# Patient Record
Sex: Male | Born: 1975 | Race: White | Hispanic: No | Marital: Single | Smoking: Current every day smoker
Health system: Southern US, Community
[De-identification: ages and names within clinical notes are randomized; demographics above are authoritative.]

## PROBLEM LIST (undated history)

## (undated) DIAGNOSIS — N200 Calculus of kidney: Secondary | ICD-10-CM

## (undated) DIAGNOSIS — T7840XA Allergy, unspecified, initial encounter: Secondary | ICD-10-CM

## (undated) HISTORY — DX: Allergy, unspecified, initial encounter: T78.40XA

---

## 2004-04-17 ENCOUNTER — Emergency Department (HOSPITAL_COMMUNITY): Admission: EM | Admit: 2004-04-17 | Discharge: 2004-04-17 | Payer: Self-pay | Admitting: Emergency Medicine

## 2006-11-30 ENCOUNTER — Ambulatory Visit (HOSPITAL_COMMUNITY): Admission: RE | Admit: 2006-11-30 | Discharge: 2006-11-30 | Payer: Self-pay | Admitting: Urology

## 2010-08-07 NOTE — H&P (Signed)
Drew Long, Drew Long                  ACCOUNT NO.:  [id number]   MEDICAL RECORD NO.:  [id number]          PATIENT TYPE:  AMB   LOCATION:  DAY                           FACILITY:  [facility]   PHYSICIAN:  Ky Barban, M.D.DATE OF BIRTH:  1976/03/21   DATE OF ADMISSION:  DATE OF DISCHARGE:  LH                              HISTORY & PHYSICAL   CHIEF COMPLAINT:  Weakened right flank pain.   HISTORY OF PRESENT ILLNESS:  A 35 year old gentleman who came to the  emergency room about a week ago with right renal colic.  CT scan showed  3 mm stone in the right ureteral junction causing obstruction.  He  continued to have pain at home.  However, since,  no fever, chills or  voiding difficulty.  He has severe constipation also, and denies having  any urinary stone in the past.   PAST MEDICAL HISTORY:  Negative.   FAMILY HISTORY:  Negative.   PERSONAL HISTORY:  Does not smoke or drink.   REVIEW OF SYSTEMS:  __________.   PHYSICAL EXAMINATION:  VITAL SIGNS:  Blood pressure 113/73, temperature  97.7.  NEUROLOGICAL:  No gross neurological deficit.  HEENT:  Negative.  CHEST:  Symmetrical.  HEART:  Regular sinus rhythm, no murmur.  ABDOMEN:  Soft, flat.  Liver, spleen, kidneys are not palpable.  BACK:  1+ right CVA tenderness.  GENITALS:  Circumcised, meatus adequate, testicles are normal.  RECTAL:  Deferred.  EXTREMITIES:  Normal.   IMPRESSION:  Right ureteral calculus.   RECOMMENDATIONS:  Observation.  I told him if he continues to have pain  then we can go ahead and do the stone basket, and he wanted me to  schedule him for that.  I told him if he passes the stone to let me  know.  I  discussed the procedure of stone basket in detail with the patient and  his mother-in-law.  Told them of the procedure's complications  especially ureter perforation leading to open surgery, use of double J  stent.  Also placed on Flomax 0.4 mg one a day #7.  Samples are given.  Scheduled him for Friday,  12th of September.      Ky Barban, M.D.  Electronically Signed     MIJ/MEDQ  D:  12/01/2006  T:  12/01/2006  Job:  [id number]

## 2011-01-01 LAB — BASIC METABOLIC PANEL
BUN: 18
CO2: 32
Glucose, Bld: 165 — ABNORMAL HIGH
Potassium: 4.3
Sodium: 135

## 2011-01-01 LAB — URINALYSIS, ROUTINE W REFLEX MICROSCOPIC
Nitrite: NEGATIVE
Protein, ur: NEGATIVE
pH: 5.5

## 2011-01-01 LAB — CBC
HCT: 39.4
Hemoglobin: 13.4
MCHC: 34
MCV: 85.2
RDW: 13.1

## 2011-01-01 LAB — DIFFERENTIAL
Basophils Absolute: 0
Basophils Relative: 0
Eosinophils Absolute: 0.1
Eosinophils Relative: 2
Monocytes Absolute: 0.5

## 2011-01-01 LAB — URINE MICROSCOPIC-ADD ON

## 2012-03-27 ENCOUNTER — Ambulatory Visit (INDEPENDENT_AMBULATORY_CARE_PROVIDER_SITE_OTHER): Payer: Managed Care, Other (non HMO) | Admitting: Internal Medicine

## 2012-03-27 ENCOUNTER — Ambulatory Visit: Payer: Managed Care, Other (non HMO)

## 2012-03-27 VITALS — BP 97/61 | HR 88 | Temp 98.1°F | Resp 16 | Ht 69.0 in | Wt 164.6 lb

## 2012-03-27 DIAGNOSIS — R05 Cough: Secondary | ICD-10-CM

## 2012-03-27 DIAGNOSIS — IMO0001 Reserved for inherently not codable concepts without codable children: Secondary | ICD-10-CM

## 2012-03-27 DIAGNOSIS — F17200 Nicotine dependence, unspecified, uncomplicated: Secondary | ICD-10-CM | POA: Insufficient documentation

## 2012-03-27 DIAGNOSIS — J1100 Influenza due to unidentified influenza virus with unspecified type of pneumonia: Secondary | ICD-10-CM

## 2012-03-27 MED ORDER — HYDROCODONE-HOMATROPINE 5-1.5 MG/5ML PO SYRP: 5.0000 mL | ORAL_SOLUTION | Freq: Four times a day (QID) | ORAL | Status: AC | PRN

## 2012-03-27 MED ORDER — AZITHROMYCIN 500 MG PO TABS: 500.0000 mg | ORAL_TABLET | Freq: Every day | ORAL | Status: AC

## 2012-03-27 NOTE — Progress Notes (Signed)
  Subjective:    Patient ID: Drew Long, male    DOB: 02/16/1976, 37 y.o.   MRN: [id number]  HPI One week ago he developed the onset of flu symptoms/as his fever was resolving yesterday he developed a worsening of the cough with productive sputum and pleuritic-type chest discomfort anteriorly No shortness of breath/no hemoptysis No nausea and vomiting No underlying illnesses or medications He hasn't smoked in 5 days Review of Systems     Objective:   Physical Exam Vital signs stable TMs clear Nares clear Throat clear/no nodes Chest with rales at the left base/no wheezing      [facility] reading (PRIMARY) by  Dr. Merla Riches no infiltrate or effusion   Assessment & Plan:  Problem #1 pneumonia post flu Meds ordered this encounter  Medications  . azithromycin (ZITHROMAX) 500 MG tablet    Sig: Take 1 tablet (500 mg total) by mouth daily.    Dispense:  5 tablet    Refill:  0  . HYDROcodone-homatropine (HYCODAN) 5-1.5 MG/5ML syrup    Sig: Take 5 mLs by mouth every 6 (six) hours as needed for cough.    Dispense:  120 mL    Refill:  0   problem #2 nicotine addition Discuss continued efforts at cessation now that he has quit for 5 days Recheck in 3 days if not better

## 2012-04-09 ENCOUNTER — Encounter (HOSPITAL_BASED_OUTPATIENT_CLINIC_OR_DEPARTMENT_OTHER): Payer: Self-pay | Admitting: *Deleted

## 2012-04-09 ENCOUNTER — Emergency Department (HOSPITAL_BASED_OUTPATIENT_CLINIC_OR_DEPARTMENT_OTHER)
Admission: EM | Admit: 2012-04-09 | Discharge: 2012-04-09 | Disposition: A | Discharge status: Home / Self Care | Payer: Managed Care, Other (non HMO) | Attending: Emergency Medicine | Admitting: Emergency Medicine

## 2012-04-09 DIAGNOSIS — T50904A Poisoning by unspecified drugs, medicaments and biological substances, undetermined, initial encounter: Secondary | ICD-10-CM | POA: Insufficient documentation

## 2012-04-09 DIAGNOSIS — T50901A Poisoning by unspecified drugs, medicaments and biological substances, accidental (unintentional), initial encounter: Secondary | ICD-10-CM | POA: Insufficient documentation

## 2012-04-09 DIAGNOSIS — F17200 Nicotine dependence, unspecified, uncomplicated: Secondary | ICD-10-CM | POA: Insufficient documentation

## 2012-04-09 DIAGNOSIS — Y939 Activity, unspecified: Secondary | ICD-10-CM | POA: Insufficient documentation

## 2012-04-09 DIAGNOSIS — Z792 Long term (current) use of antibiotics: Secondary | ICD-10-CM | POA: Insufficient documentation

## 2012-04-09 DIAGNOSIS — Y9289 Other specified places as the place of occurrence of the external cause: Secondary | ICD-10-CM | POA: Insufficient documentation

## 2012-04-09 DIAGNOSIS — T50905A Adverse effect of unspecified drugs, medicaments and biological substances, initial encounter: Secondary | ICD-10-CM
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
## 2012-10-02 ENCOUNTER — Ambulatory Visit: Payer: Self-pay

## 2012-10-02 ENCOUNTER — Encounter (HOSPITAL_COMMUNITY): Payer: Self-pay

## 2012-10-02 ENCOUNTER — Emergency Department (HOSPITAL_COMMUNITY): Payer: Self-pay

## 2012-10-02 ENCOUNTER — Emergency Department (HOSPITAL_COMMUNITY)
Admission: EM | Admit: 2012-10-02 | Discharge: 2012-10-02 | Disposition: A | Discharge status: Home / Self Care | Payer: Self-pay | Attending: Emergency Medicine | Admitting: Emergency Medicine

## 2012-10-02 DIAGNOSIS — R109 Unspecified abdominal pain: Secondary | ICD-10-CM | POA: Insufficient documentation

## 2012-10-02 DIAGNOSIS — K5900 Constipation, unspecified: Secondary | ICD-10-CM | POA: Insufficient documentation

## 2012-10-02 DIAGNOSIS — F17200 Nicotine dependence, unspecified, uncomplicated: Secondary | ICD-10-CM | POA: Insufficient documentation

## 2012-10-02 DIAGNOSIS — N200 Calculus of kidney: Secondary | ICD-10-CM | POA: Insufficient documentation

## 2012-10-02 HISTORY — DX: Calculus of kidney: N20.0

## 2012-10-02 LAB — URINALYSIS, ROUTINE W REFLEX MICROSCOPIC
Hgb urine dipstick: NEGATIVE
Nitrite: NEGATIVE
Specific Gravity, Urine: 1.014 (ref 1.005–1.030)
Urobilinogen, UA: 0.2 mg/dL (ref 0.0–1.0)

## 2012-10-02 MED ORDER — KETOROLAC TROMETHAMINE 30 MG/ML IJ SOLN
30.0000 mg | Freq: Once | INTRAMUSCULAR | Status: AC
Start: 2012-10-02 — End: 2012-10-02
  Administered 2012-10-02: 30 mg via INTRAVENOUS
  Filled 2012-10-02: qty 1

## 2012-10-02 MED ORDER — ONDANSETRON 8 MG PO TBDP: 8.0000 mg | ORAL_TABLET | Freq: Once | ORAL | Status: DC

## 2012-10-02 MED ORDER — ONDANSETRON HCL 4 MG PO TABS: 4.0000 mg | ORAL_TABLET | Freq: Three times a day (TID) | ORAL | Status: AC | PRN

## 2012-10-02 MED ORDER — ONDANSETRON HCL 4 MG/2ML IJ SOLN
4.0000 mg | Freq: Once | INTRAMUSCULAR | Status: AC
  Administered 2012-10-02: 4 mg via INTRAVENOUS
  Filled 2012-10-02: qty 2

## 2012-10-02 MED ORDER — POLYVINYL ALCOHOL 1.4 % OP SOLN
2.0000 [drp] | OPHTHALMIC | Status: DC | PRN
  Filled 2012-10-02 (×2): qty 15

## 2012-10-02 MED ORDER — IBUPROFEN 800 MG PO TABS: 800.0000 mg | ORAL_TABLET | Freq: Three times a day (TID) | ORAL | Status: DC | PRN

## 2012-10-02 NOTE — ED Notes (Signed)
Went to collect I stat - pt refused.  Doesn't want any kind of blood work done.

## 2012-10-02 NOTE — ED Provider Notes (Signed)
History    CSN: [id number] Arrival date & time 10/02/12  1136  First MD Initiated Contact with Patient 10/02/12 1142     Chief Complaint  Patient presents with  . Flank Pain  . Nephrolithiasis   (Consider location/radiation/quality/duration/timing/severity/associated sxs/prior Treatment) HPI Comments: Patient reports left flank pain that is intermittent and sharp and feels like prior kidney stone.  Pain radiates into left lower abdomen.  Has had recent constipation over the past two weeks that is resolving.  Has had kidney stones in the past but has never had surgery.  Does not have local urologist.  Denies fevers, chills, myalgias, N/V, dysuria, urinary frequency or urgency, penile or testicular pain.   The history is provided by the patient.   Past Medical History  Diagnosis Date  . Allergy    No past surgical history on file. Family History  Problem Relation Age of Onset  . Heart Problems Mother    History  Substance Use Topics  . Smoking status: Current Every Day Smoker -- 0.50 packs/day    Types: Cigarettes  . Smokeless tobacco: Never Used  . Alcohol Use: 2.5 oz/week    5 drink(s) per week    Review of Systems  Allergies  Review of patient's allergies indicates no known allergies.  Home Medications   Current Outpatient Rx  Name  Route  Sig  Dispense  Refill  . azithromycin (ZITHROMAX) 500 MG tablet   Oral   Take 1 tablet (500 mg total) by mouth daily.   5 tablet   0    BP 130/103  Pulse 111  Temp(Src) 98.1 F (36.7 C) (Oral)  Resp 18  Ht 5\' 9"  (1.753 m)  Wt 156 lb (70.761 kg)  BMI 23.03 kg/m2  SpO2 100% Physical Exam  Nursing note and vitals reviewed. Constitutional: He appears well-developed and well-nourished. No distress.  HENT:  Head: Normocephalic and atraumatic.  Neck: Neck supple.  Cardiovascular: Normal rate and regular rhythm.   Pulmonary/Chest: Effort normal and breath sounds normal. No respiratory distress. He has no wheezes. He has  no rales.  Abdominal: Soft. He exhibits no distension and no mass. There is no tenderness. There is no rebound and no guarding.  Neurological: He is alert. He exhibits normal muscle tone.  Skin: He is not diaphoretic.    ED Course  Procedures (including critical care time) Labs Reviewed  URINE CULTURE  URINALYSIS, ROUTINE W REFLEX MICROSCOPIC   No results found.  Pt refused labs.    CT results did not cross over, faxed to me.  Impression: 1. No e/o acute abnormality in abd/pelvis.  2. Specifically, no intrarenal or ureteral stones.  3. Normal appendix.   3:46 PM Reexamination of abdomen is unremarkable: soft, nondistended, mildly tender to palpation LLQ, no guarding, no rebound.    Pt has been tearful during visit.  States it is because he was in pain last night and did not have anyone to help him.  Denies any problems, denies any concerns.  Declines any help with this matter or wanting to talk about it further.   Filed Vitals:   10/02/12 1536  BP: 115/64  Pulse: 65  Temp:   Resp: 17      1. Left flank pain     MDM  Pt with left flank pain, no associated symptoms.  Afebrile, nontoxic.  Abdominal exam is nonsurgical.  Pt refused labs.  CT negative.  UA negative.  Reexamination of abdomen is unremarkable.  D/C home with NSAIDs,  zofran.  Discussed all results with patient.  Pt given return precautions.  Pt verbalizes understanding and agrees with plan.     Trixie Dredge, PA-C 10/02/12 1550  [place], PA-C 10/02/12 1551

## 2012-10-02 NOTE — Progress Notes (Signed)
Patient reports he no longer has [employer] or a pcp.  Patient reports that he has changed his job position and will have insurance eventually.  EDCM and [facility] rep saw patient.  [facility] gave patient discount prescription card.  [facility] rep gave patient list of pcps who accept self pay patients.  Patient thankful for resources.

## 2012-10-02 NOTE — ED Notes (Signed)
Patient refused to change into hospital gown.

## 2012-10-02 NOTE — ED Notes (Signed)
Patient reminded that a urine specimen was ordered. Patient states that he is fasting for Ramada and may not be able to urinate.

## 2012-10-02 NOTE — ED Notes (Signed)
Patient reports a history of kidney stones. Patient c/o left flank pain that radiates to the left lower abdomen. Patient denies any problems urinating.

## 2012-10-02 NOTE — ED Notes (Signed)
PA West at bedside  

## 2012-10-02 NOTE — ED Notes (Signed)
Patient transported to CT 

## 2012-10-04 LAB — URINE CULTURE: Culture: NO GROWTH

## 2012-10-04 NOTE — ED Provider Notes (Signed)
Medical screening examination/treatment/procedure(s) were performed by non-physician practitioner and as supervising physician I was immediately available for consultation/collaboration.  Kalob Bergen, MD 10/04/12 1456 

## 2014-02-10 ENCOUNTER — Encounter (HOSPITAL_COMMUNITY): Payer: Self-pay | Admitting: Emergency Medicine

## 2014-02-10 ENCOUNTER — Emergency Department (HOSPITAL_COMMUNITY): Payer: Self-pay

## 2014-02-10 ENCOUNTER — Emergency Department (HOSPITAL_COMMUNITY): Payer: Managed Care, Other (non HMO)

## 2014-02-10 ENCOUNTER — Emergency Department (HOSPITAL_COMMUNITY)
Admission: EM | Admit: 2014-02-10 | Discharge: 2014-02-11 | Disposition: A | Discharge status: Home / Self Care | Payer: Self-pay | Attending: Emergency Medicine | Admitting: Emergency Medicine

## 2014-02-10 DIAGNOSIS — Y9289 Other specified places as the place of occurrence of the external cause: Secondary | ICD-10-CM | POA: Insufficient documentation

## 2014-02-10 DIAGNOSIS — Z792 Long term (current) use of antibiotics: Secondary | ICD-10-CM | POA: Insufficient documentation

## 2014-02-10 DIAGNOSIS — Y998 Other external cause status: Secondary | ICD-10-CM | POA: Insufficient documentation

## 2014-02-10 DIAGNOSIS — Z87442 Personal history of urinary calculi: Secondary | ICD-10-CM | POA: Insufficient documentation

## 2014-02-10 DIAGNOSIS — M25552 Pain in left hip: Secondary | ICD-10-CM

## 2014-02-10 DIAGNOSIS — S76012A Strain of muscle, fascia and tendon of left hip, initial encounter: Secondary | ICD-10-CM | POA: Insufficient documentation

## 2014-02-10 DIAGNOSIS — Z720 Tobacco use: Secondary | ICD-10-CM | POA: Insufficient documentation

## 2014-02-10 DIAGNOSIS — Y9389 Activity, other specified: Secondary | ICD-10-CM | POA: Insufficient documentation

## 2014-02-10 DIAGNOSIS — R52 Pain, unspecified: Secondary | ICD-10-CM

## 2014-02-10 DIAGNOSIS — W1789XA Other fall from one level to another, initial encounter: Secondary | ICD-10-CM | POA: Insufficient documentation

## 2014-02-10 MED ORDER — HYDROMORPHONE HCL 1 MG/ML IJ SOLN
1.0000 mg | Freq: Once | INTRAMUSCULAR | Status: AC
Start: 2014-02-10 — End: 2014-02-10
  Administered 2014-02-10: 1 mg via INTRAVENOUS
  Filled 2014-02-10: qty 1

## 2014-02-10 MED ORDER — HYDROMORPHONE HCL 1 MG/ML IJ SOLN: 1.0000 mg | Freq: Once | INTRAMUSCULAR | Status: DC

## 2014-02-10 MED ORDER — KETOROLAC TROMETHAMINE 30 MG/ML IJ SOLN
30.0000 mg | Freq: Once | INTRAMUSCULAR | Status: AC
  Administered 2014-02-10: 30 mg via INTRAVENOUS
  Filled 2014-02-10: qty 1

## 2014-02-10 MED ORDER — KETOROLAC TROMETHAMINE 60 MG/2ML IM SOLN: 60.0000 mg | Freq: Once | INTRAMUSCULAR | Status: DC

## 2014-02-10 MED ORDER — MORPHINE SULFATE 4 MG/ML IJ SOLN
6.0000 mg | Freq: Once | INTRAMUSCULAR | Status: AC
  Administered 2014-02-10: 6 mg via INTRAVENOUS
  Filled 2014-02-10: qty 2

## 2014-02-10 MED ORDER — DIAZEPAM 5 MG/ML IJ SOLN
3.7500 mg | Freq: Once | INTRAMUSCULAR | Status: AC
  Administered 2014-02-10: 3.75 mg via INTRAVENOUS
  Filled 2014-02-10: qty 2

## 2014-02-10 NOTE — ED Notes (Signed)
Patient transporting to radiology.  

## 2014-02-10 NOTE — ED Notes (Signed)
Patient experiencing left hip pain that radiates to left groin. Pain described as sharp, constant. Denies numbness and tingling. On Thursday, he was able to bear weight on the left leg, just pain. After Friday, he was limited on bearing weight. Then today, no weight bearing.

## 2014-02-10 NOTE — ED Provider Notes (Signed)
2045 - Patient heard in triage screaming out, appearing to be in significant amount of pain. Appears spasming given waxing and waning nature of pain. Orders placed for pain medication in triage until patient able to be roomed in the back. Added DG lumbar spine given triage summary.  Drew MaduraKelly Onnie Alatorre, PA-C 02/10/14 2047  Enid SkeensJoshua M Zavitz, MD 02/10/14 [phone]

## 2014-02-10 NOTE — ED Notes (Signed)
Patient returned from radiology

## 2014-02-10 NOTE — ED Provider Notes (Signed)
CSN: [id number]     Arrival date & time 02/10/14  2009 History   First MD Initiated Contact with Patient 02/10/14 2046     Chief Complaint  Patient presents with  . Hip Pain     (Consider location/radiation/quality/duration/timing/severity/associated sxs/prior Treatment) HPI Comments: 38 year old male with no significant medical history, smoker, mild alcohol use presents with severe left hip pain. 2 days prior patient jumped down from 5 feet 9 and on his feet with no significant pain however the day after he had severe ear pain is gradually worsened to the point where he can't walk on it. No history of hip problems. No other injuries. No back pain. Pain with any range of motion  Patient is a 38 y.o. male presenting with hip pain. The history is provided by the patient.  Hip Pain Pertinent negatives include no chest pain, no abdominal pain, no headaches and no shortness of breath.    Past Medical History  Diagnosis Date  . Allergy   . Kidney stone    History reviewed. No pertinent past surgical history. Family History  Problem Relation Age of Onset  . Heart Problems Mother    History  Substance Use Topics  . Smoking status: Current Every Day Smoker -- 0.25 packs/day    Types: Cigarettes  . Smokeless tobacco: Never Used  . Alcohol Use: 2.5 oz/week    5 drink(s) per week     Comment: occasionally    Review of Systems  Constitutional: Negative for fever and chills.  Respiratory: Negative for shortness of breath.   Cardiovascular: Negative for chest pain.  Gastrointestinal: Negative for vomiting and abdominal pain.  Genitourinary: Negative for dysuria and flank pain.  Musculoskeletal: Positive for gait problem. Negative for back pain, neck pain and neck stiffness.  Skin: Negative for rash.  Neurological: Negative for light-headedness and headaches.      Allergies  Review of patient's allergies indicates no known allergies.  Home Medications   Prior to Admission  medications   Medication Sig Start Date End Date Taking? Authorizing Provider  azithromycin (ZITHROMAX) 500 MG tablet Take 1 tablet (500 mg total) by mouth daily. 03/27/12   Tonye Pearsonobert P Doolittle, MD  ibuprofen (ADVIL,MOTRIN) 800 MG tablet Take 1 tablet (800 mg total) by mouth every 8 (eight) hours as needed for pain. 10/02/12   Trixie DredgeEmily West, PA-C  ondansetron (ZOFRAN) 4 MG tablet Take 1 tablet (4 mg total) by mouth every 8 (eight) hours as needed for nausea. 10/02/12   Trixie DredgeEmily West, PA-C   BP 107/71 mmHg  Pulse 94  Temp(Src) 98.3 F (36.8 C) (Oral)  Resp 20  Ht 5\' 9"  (1.753 m)  Wt 164 lb (74.39 kg)  BMI 24.21 kg/m2  SpO2 100% Physical Exam  Constitutional: He is oriented to person, place, and time. He appears well-developed and well-nourished.  HENT:  Head: Normocephalic and atraumatic.  Eyes: Conjunctivae are normal. Right eye exhibits no discharge. Left eye exhibits no discharge.  Neck: Normal range of motion. Neck supple. No tracheal deviation present.  Cardiovascular: Normal rate and regular rhythm.   Pulmonary/Chest: Effort normal.  Musculoskeletal: He exhibits tenderness. He exhibits no edema.  Patient has severe pain left anterior groin and hip region with any attempted range of motion, grossly equal leg length, no obvious rotation, no significant hernia palpated, no testicle pain on the left or swelling. Neurovascular intact left lower extremity  Neurological: He is alert and oriented to person, place, and time.  Skin: Skin is warm. No rash  noted.  Psychiatric: He has a normal mood and affect.  Nursing note and vitals reviewed.   ED Course  Procedures (including critical care time) Labs Review Labs Reviewed - No data to display  Imaging Review No results found.   EKG Interpretation None      MDM   Final diagnoses:  Pain  Left hip pain   Concern for hip fracture versus muscle tear with significant amount of pain the patient in. X-ray pending, pain meds  ordered.  Patient having significant persistent pain despite unremarkable x-ray. CT scan ordered. Patient care signed out to reassess and follow-up CT results.     Enid SkeensJoshua M Christi Wirick, MD 02/14/14 [phone]

## 2014-02-10 NOTE — ED Notes (Signed)
Patient resting quietly with eyes closed. Patient denies any needs or complaints.

## 2014-02-10 NOTE — ED Notes (Signed)
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 Unrelieved by OTC meds.

## 2014-02-11 MED ORDER — METHOCARBAMOL 500 MG PO TABS: 1000.0000 mg | ORAL_TABLET | Freq: Three times a day (TID) | ORAL | Status: AC | PRN

## 2014-02-11 MED ORDER — IBUPROFEN 600 MG PO TABS: 600.0000 mg | ORAL_TABLET | Freq: Four times a day (QID) | ORAL | Status: AC | PRN

## 2014-02-11 MED ORDER — OXYCODONE-ACETAMINOPHEN 5-325 MG PO TABS: 1.0000 | ORAL_TABLET | ORAL | Status: AC | PRN

## 2014-02-11 MED ORDER — MORPHINE SULFATE 4 MG/ML IJ SOLN
4.0000 mg | Freq: Once | INTRAMUSCULAR | Status: AC
  Administered 2014-02-11: 4 mg via INTRAVENOUS
  Filled 2014-02-11: qty 1

## 2014-02-11 NOTE — ED Provider Notes (Signed)
CT without any acute abnormalities. Patient has tenderness with light palpation over the lateral hip and inguinal areas. There is no deformity. Distal pulses are intact. Symptoms are consistent with soft tissue injury likely muscle spasm. We'll treat symptomatically. Patient has been given follow-up with orthopedics if pain continues. Do not believe further workup is necessary in the emergency department at this point.  Loren Raceravid Ariyanna Oien, MD 02/11/14 [phone]

## 2014-02-11 NOTE — Discharge Instructions (Signed)
Joint Sprain A sprain is a tear or stretch in the ligaments that hold a joint together. Severe sprains may need as long as 3-6 weeks of immobilization and/or exercises to heal completely. Sprained joints should be rested and protected. If not, they can become unstable and prone to re-injury. Proper treatment can reduce your pain, shorten the period of disability, and reduce the risk of repeated injuries. TREATMENT   Rest and elevate the injured joint to reduce pain and swelling.  Apply ice packs to the injury for 20-30 minutes every 2-3 hours for the next 2-3 days.  Keep the injury wrapped in a compression bandage or splint as long as the joint is painful or as instructed by your caregiver.  Do not use the injured joint until it is completely healed to prevent re-injury and chronic instability. Follow the instructions of your caregiver.  Long-term sprain management may require exercises and/or treatment by a physical therapist. Taping or special braces may help stabilize the joint until it is completely better. SEEK MEDICAL CARE IF:   You develop increased pain or swelling of the joint.  You develop increasing redness and warmth of the joint.  You develop a fever.  It becomes stiff.  Your hand or foot gets cold or numb. Document Released: 04/15/2004 Document Revised: 05/31/2011 Document Reviewed: 03/25/2008 [facility] Patient Information 2015 [place], [place]. This information is not intended to replace advice given to you by your health care provider. Make sure you discuss any questions you have with your health care provider.  Muscle Strain A muscle strain is an injury that occurs when a muscle is stretched beyond its normal length. Usually a small number of muscle fibers are torn when this happens. Muscle strain is rated in degrees. First-degree strains have the least amount of muscle fiber tearing and pain. Second-degree and third-degree strains have increasingly more tearing and pain.    Usually, recovery from muscle strain takes 1-2 weeks. Complete healing takes 5-6 weeks.  CAUSES  Muscle strain happens when a sudden, violent force placed on a muscle stretches it too far. This may occur with lifting, sports, or a fall.  RISK FACTORS Muscle strain is especially common in athletes.  SIGNS AND SYMPTOMS At the site of the muscle strain, there may be:  Pain.  Bruising.  Swelling.  Difficulty using the muscle due to pain or lack of normal function. DIAGNOSIS  Your health care provider will perform a physical exam and ask about your medical history. TREATMENT  Often, the best treatment for a muscle strain is resting, icing, and applying cold compresses to the injured area.  HOME CARE INSTRUCTIONS   Use the PRICE method of treatment to promote muscle healing during the first 2-3 days after your injury. The PRICE method involves:  Protecting the muscle from being injured again.  Restricting your activity and resting the injured body part.  Icing your injury. To do this, put ice in a plastic bag. Place a towel between your skin and the bag. Then, apply the ice and leave it on from 15-20 minutes each hour. After the third day, switch to moist heat packs.  Apply compression to the injured area with a splint or elastic bandage. Be careful not to wrap it too tightly. This may interfere with blood circulation or increase swelling.  Elevate the injured body part above the level of your heart as often as you can.  Only take over-the-counter or prescription medicines for pain, discomfort, or fever as directed by your  health care provider.  Warming up prior to exercise helps to prevent future muscle strains. SEEK MEDICAL CARE IF:   You have increasing pain or swelling in the injured area.  You have numbness, tingling, or a significant loss of strength in the injured area. MAKE SURE YOU:   Understand these instructions.  Will watch your condition.  Will get help right  away if you are not doing well or get worse. Document Released: 03/08/2005 Document Revised: 12/27/2012 Document Reviewed: 10/05/2012 [facility]Exit[facility] Patient Information 2015 [place], [place]. This information is not intended to replace advice given to you by your health care provider. Make sure you discuss any questions you have with your health care provider.

## 2014-02-11 NOTE — ED Notes (Signed)
Patient requesting pain medication since having to move in CT. Will inform provider.

## 2014-02-11 NOTE — ED Notes (Signed)
Pt returned from CT °
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
## 2016-03-26 IMAGING — CT CT HIP*L* W/O CM
2 of 3 series · 12 of 32 positions shown, 17 images · non-contrast
Comparison: None.

CLINICAL DATA: Left hip pain after jumping down from scaffolding
approximately 4 to 5 feet

EXAM:
CT OF THE LEFT HIP WITHOUT CONTRAST
TECHNIQUE: Multidetector CT imaging of the left hip was performed according to
the standard protocol. Multiplanar CT image reconstructions were
also generated.

[Series 3: hip st · axial · 0.37mm/px · z∈[-601,-511]mm · 4 of 32 slices shown, 9 images]
[im 7/32  soft-tissue]
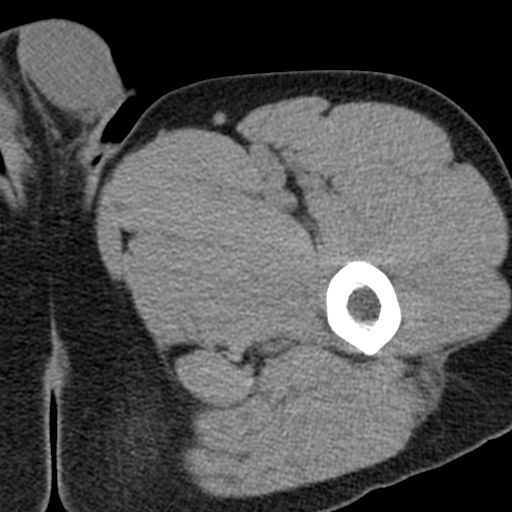
[im 7/32  lung]
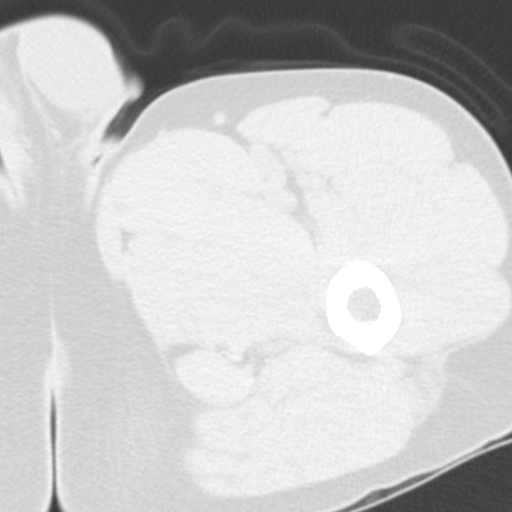
[im 7/32  bone]
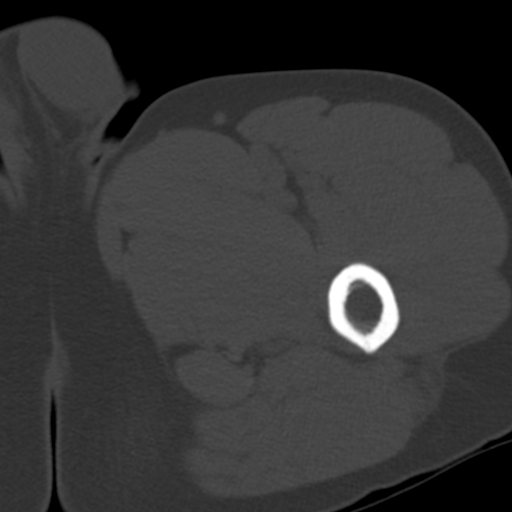
[im 13/32  soft-tissue]
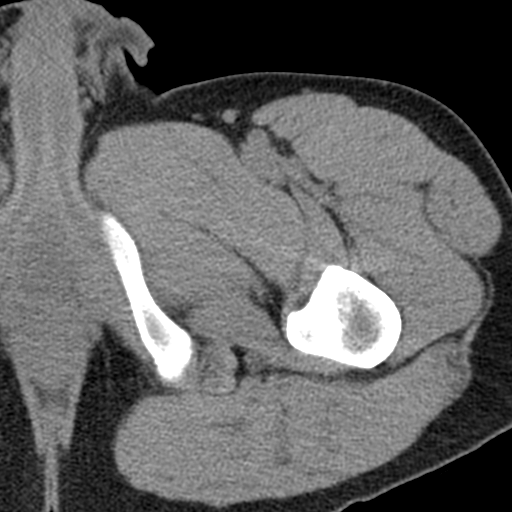
[im 13/32  lung]
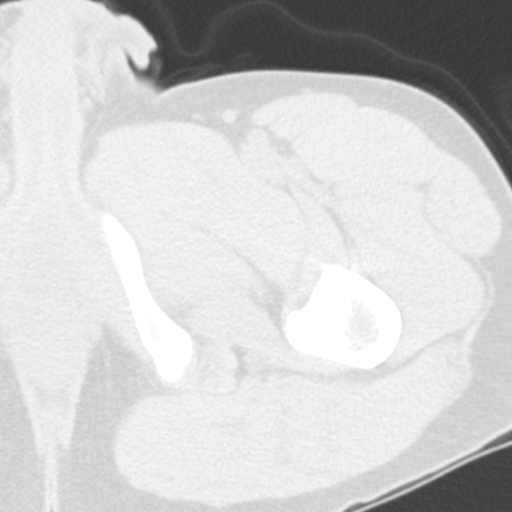
[im 19/32  soft-tissue]
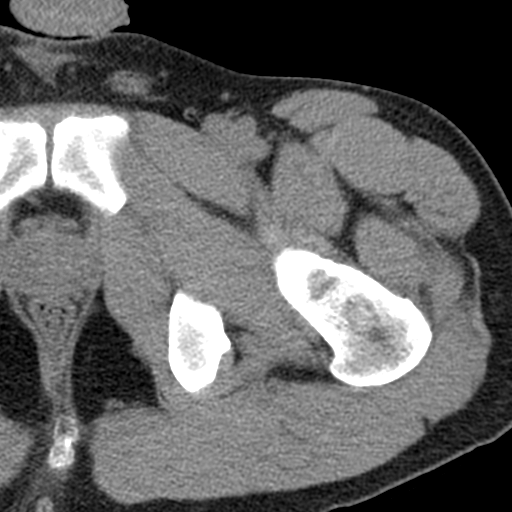
[im 19/32  lung]
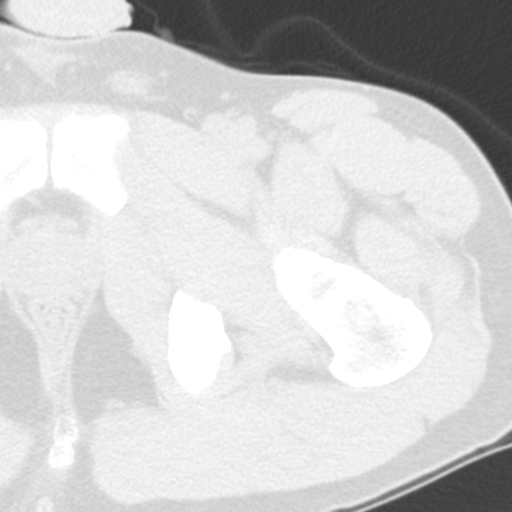
[im 25/32  soft-tissue]
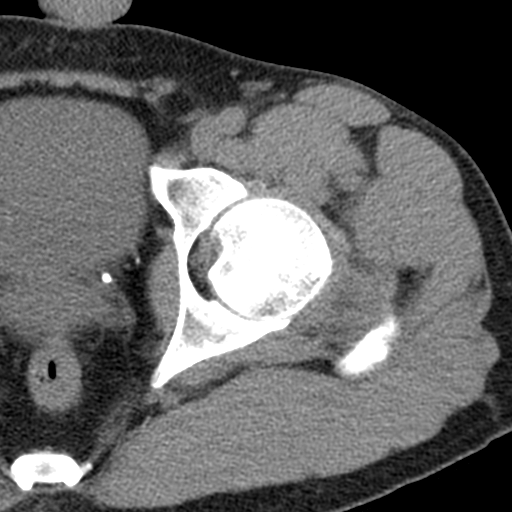
[im 25/32  lung]
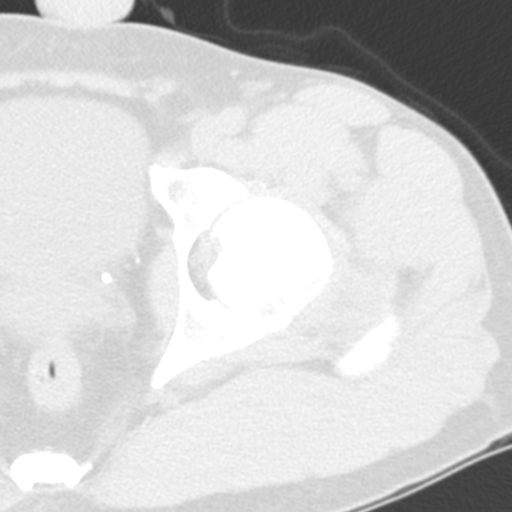

[Series 6: axial bone · axial · 0.30mm/px · z∈[-615,-482]mm · 8 of 80 slices shown]
[im 6/80  bone]
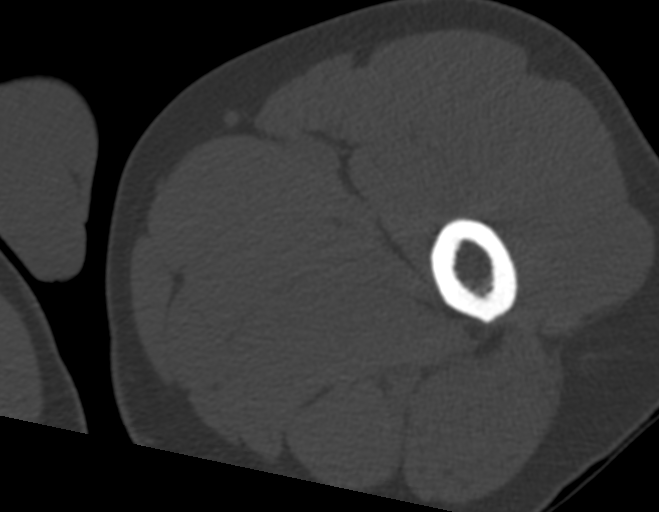
[im 17/80  bone]
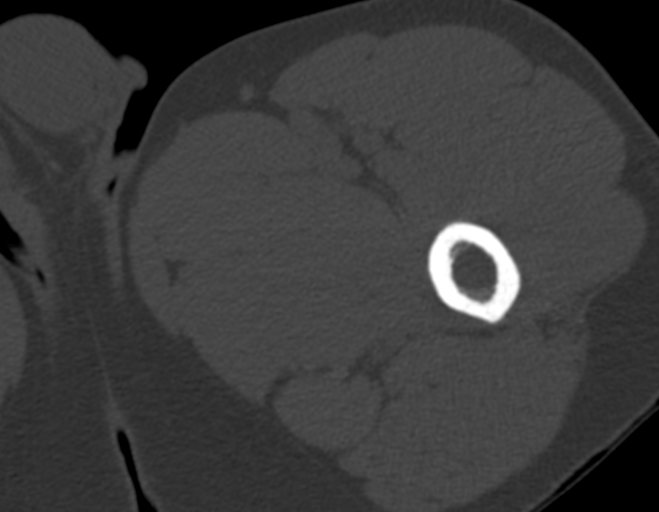
[im 29/80  bone]
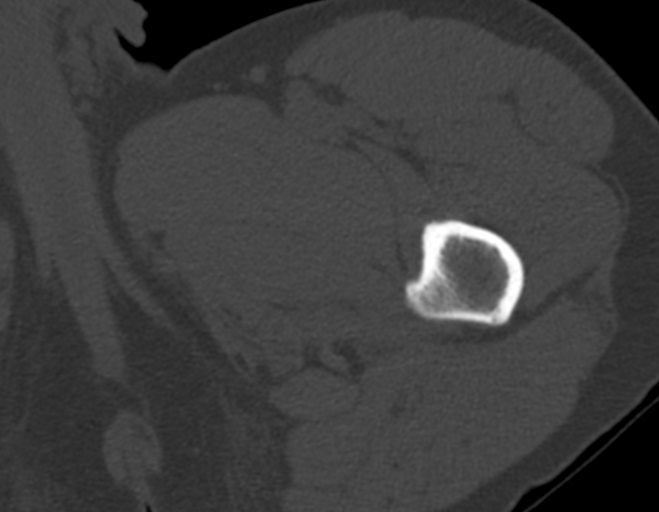
[im 34/80  bone]
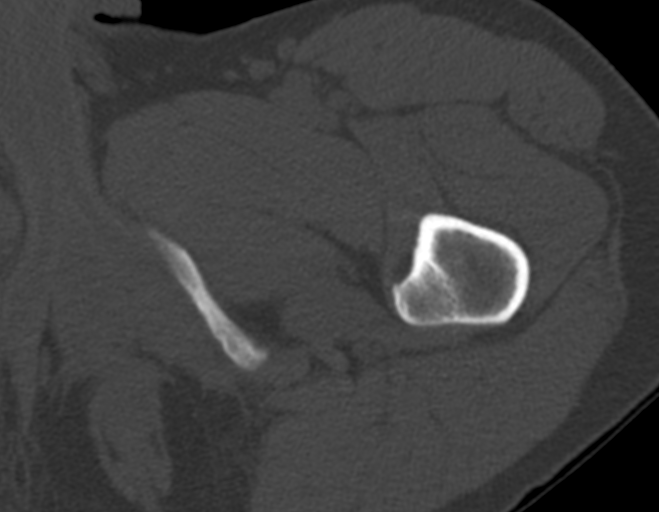
[im 46/80  bone]
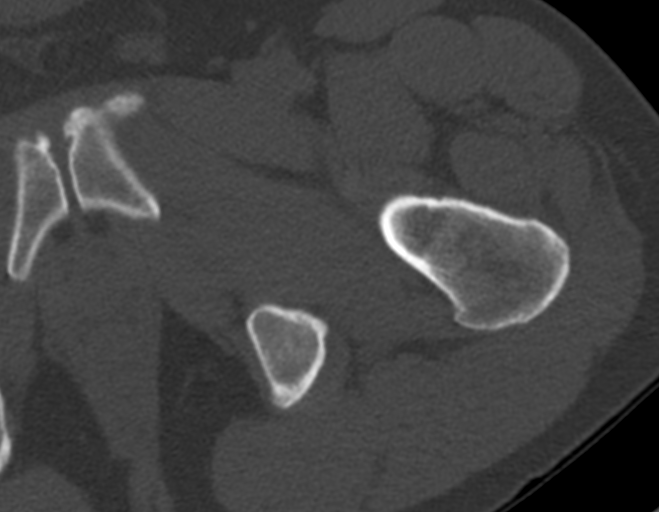
[im 51/80  bone]
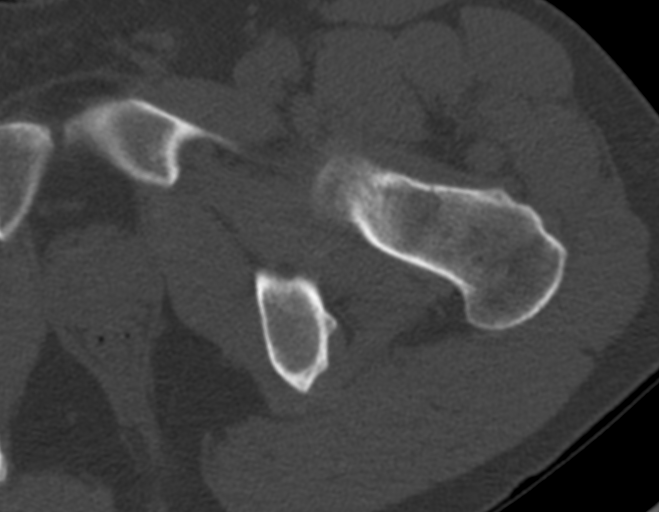
[im 63/80  bone]
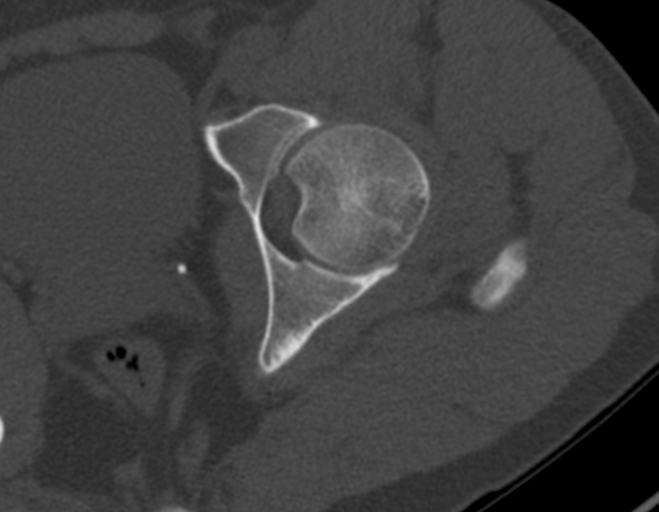
[im 74/80  bone]
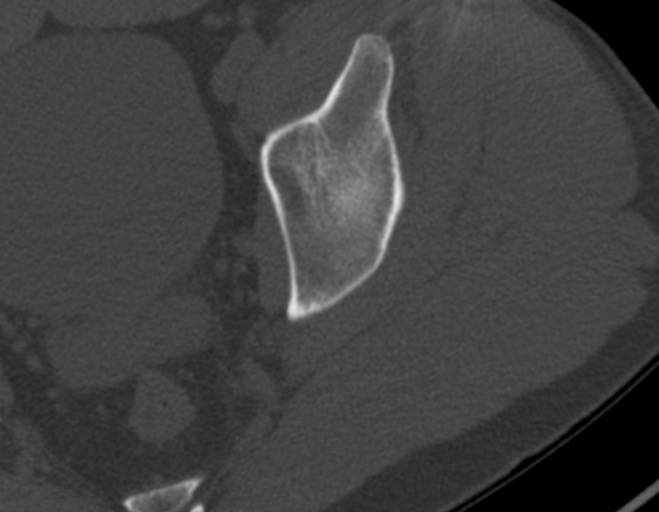

[12 of 32 positions shown; findings below may reference images not displayed]

FINDINGS: The left hip appears located. There is no evidence for left hip
fracture. No significant arthropathy identified. The urinary bladder
appears stress set the visualized portions of the bladder and
prostate gland are unremarkable.
IMPRESSION: 1. No evidence for fracture of the left hip.
